# Patient Record
Sex: Male | Born: 1995 | Race: White | Hispanic: No | Marital: Single | Smoking: Never smoker
Health system: Southern US, Community
[De-identification: ages and names within clinical notes are randomized; demographics above are authoritative.]

## PROBLEM LIST (undated history)

## (undated) DIAGNOSIS — J45909 Unspecified asthma, uncomplicated: Secondary | ICD-10-CM

## (undated) DIAGNOSIS — Z789 Other specified health status: Secondary | ICD-10-CM

## (undated) HISTORY — PX: EYE SURGERY: SHX253

## (undated) HISTORY — PX: EXTERNAL EAR SURGERY: SHX627

## (undated) HISTORY — PX: INNER EAR SURGERY: SHX679

---
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
## 2017-01-29 DIAGNOSIS — L700 Acne vulgaris: Secondary | ICD-10-CM | POA: Diagnosis not present

## 2017-03-07 ENCOUNTER — Emergency Department (HOSPITAL_COMMUNITY)
Admission: EM | Admit: 2017-03-07 | Discharge: 2017-03-07 | Disposition: A | Discharge status: Home / Self Care | Payer: BLUE CROSS/BLUE SHIELD | Attending: Emergency Medicine | Admitting: Emergency Medicine

## 2017-03-07 ENCOUNTER — Encounter (HOSPITAL_COMMUNITY): Payer: Self-pay

## 2017-03-07 DIAGNOSIS — Z79899 Other long term (current) drug therapy: Secondary | ICD-10-CM | POA: Insufficient documentation

## 2017-03-07 DIAGNOSIS — J111 Influenza due to unidentified influenza virus with other respiratory manifestations: Secondary | ICD-10-CM | POA: Diagnosis not present

## 2017-03-07 DIAGNOSIS — R509 Fever, unspecified: Secondary | ICD-10-CM | POA: Diagnosis not present

## 2017-03-07 DIAGNOSIS — J45909 Unspecified asthma, uncomplicated: Secondary | ICD-10-CM | POA: Diagnosis not present

## 2017-03-07 DIAGNOSIS — R69 Illness, unspecified: Secondary | ICD-10-CM

## 2017-03-07 LAB — RAPID STREP SCREEN (MED CTR MEBANE ONLY): STREPTOCOCCUS, GROUP A SCREEN (DIRECT): NEGATIVE

## 2017-03-07 LAB — MONONUCLEOSIS SCREEN: Mono Screen: NEGATIVE

## 2017-03-07 LAB — CBG MONITORING, ED: GLUCOSE-CAPILLARY: 100 mg/dL — AB (ref 65–99)

## 2017-03-07 MED ORDER — ACETAMINOPHEN 160 MG/5ML PO SOLN
650.0000 mg | Freq: Once | ORAL | Status: AC
  Administered 2017-03-07: 650 mg via ORAL
  Filled 2017-03-07: qty 20.3

## 2017-03-07 MED ORDER — ONDANSETRON HCL 4 MG PO TABS: 4.0000 mg | ORAL_TABLET | Freq: Three times a day (TID) | ORAL | 0 refills | Status: AC | PRN

## 2017-03-07 MED ORDER — IBUPROFEN 100 MG/5ML PO SUSP
600.0000 mg | Freq: Once | ORAL | Status: AC
  Administered 2017-03-07: 600 mg via ORAL
  Filled 2017-03-07: qty 30

## 2017-03-07 NOTE — ED Triage Notes (Signed)
Fever, sore throat, body aches since Monday, taking dayquil at home.

## 2017-03-07 NOTE — Discharge Instructions (Signed)
Drink plenty of fluids. Take ibuprofen 600 mg + acetaminophen 1000 mg every 6 hrs for fever and body aches. You can take the liquid because of your sore throat. Stay home until your fever is gone. Use the zofran for nausea.  Return to the emergency room if you have uncontrolled vomiting , you feel like you getting dehydrated, or if you get short of breath.

## 2017-03-07 NOTE — ED Provider Notes (Signed)
[facility] EMERGENCY DEPARTMENT Provider Note   CSN: [id number] Arrival date & time: 03/07/17  0401  Time seen 04:20 AM   History   Chief Complaint Chief Complaint  Patient presents with  . Fever    HPI Corey Steele is a 21 y.o. male.  HPI patient reports he has had a sore throat since Monday the 22nd.  He states he has a frontal and facial headache with yellow rhinorrhea and lots of sneezing but he states he has been taking DayQuil.  He states this morning he woke up and felt like he was having chills and then he got hot and he was having diffuse myalgias and body aches which prompted him to have his mother bring him to the ED.  She states his temperature at home was 100.3.  He has taken no medications for his fever.  He has had some nausea for the past 3 days without vomiting or diarrhea.  He is unaware of anybody else with a sore throat. Mother has had a URI the past 1 1/2 weeks.   Patient also states he has had episodes where he feels weak and shaky off and on for the past couple weeks.  He states that happens about once a week.  The first time it happened after eating at a seafood restaurant.  PCP Benita Stabile, MD   Past Medical History:  Diagnosis Date  . Asthma   . Medical history non-contributory     Patient Active Problem List   Diagnosis Date Noted  . Norovirus 05/13/2014  . Abdominal pain 05/10/2014  . Gastroenteritis 05/10/2014    Past Surgical History:  Procedure Laterality Date  . EXTERNAL EAR SURGERY Right   . EYE SURGERY    . INNER EAR SURGERY         Home Medications    Prior to Admission medications   Medication Sig Start Date End Date Taking? Authorizing Provider  albuterol (PROVENTIL HFA;VENTOLIN HFA) 108 (90 Base) MCG/ACT inhaler Inhale 2 puffs into the lungs every 4 (four) hours as needed for wheezing or shortness of breath. 12/26/15   Ward, Layla Maw, DO  guaiFENesin-dextromethorphan (ROBITUSSIN DM) 100-10 MG/5ML syrup Take 5 mLs by mouth  every 4 (four) hours as needed for cough. 12/26/15   Ward, Layla Maw, DO  ibuprofen (ADVIL,MOTRIN) 800 MG tablet Take 1 tablet (800 mg total) by mouth every 8 (eight) hours as needed for mild pain. 12/26/15   Ward, Layla Maw, DO  ISOtretinoin (ACCUTANE PO) Take by mouth.    [provider]  methylphenidate (METADATE CD) 20 MG CR capsule Take 20 mg by mouth every morning.    [provider]  ondansetron (ZOFRAN ODT) 4 MG disintegrating tablet Take 1 tablet (4 mg total) by mouth every 8 (eight) hours as needed for nausea or vomiting. 12/26/15   Ward, Layla Maw, DO  ondansetron (ZOFRAN) 4 MG tablet Take 1 tablet (4 mg total) by mouth every 8 (eight) hours as needed for nausea or vomiting. 03/07/17   Devoria Albe, MD    Family History Family History  Problem Relation Age of Onset  . Asthma Mother   . COPD Mother   . Hyperlipidemia Father     Social History Social History  Substance Use Topics  . Smoking status: Never Smoker  . Smokeless tobacco: Never Used  . 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  Review of Systems Review of Systems  All other systems reviewed and are negative.    Physical Exam Updated Vital Signs BP 128/89 (BP Location: Right Arm)   Pulse 96   Temp 100 F (37.8 C) (Oral)   Resp 16   Ht 5\' 5"  (1.651 m)   Wt 56.7 kg (125 lb)   SpO2 100%   BMI 20.80 kg/m   Vital signs normal except low grade temp   Physical Exam  Constitutional: He is oriented to person, place, and time. He appears well-developed and well-nourished.  Non-toxic appearance. He does not appear ill. No distress.  HENT:  Head: Normocephalic and atraumatic.  Right Ear: External ear normal.  Left Ear: External ear normal.  Nose: Nose normal. No mucosal edema or rhinorrhea.  Mouth/Throat: Mucous membranes are normal. No dental abscesses or uvula swelling. Posterior oropharyngeal erythema present.  Mildly  enlarged tonsils  Eyes: Pupils are equal, round, and reactive to light. Conjunctivae and EOM are normal.  Neck: Normal range of motion and full passive range of motion without pain. Neck supple.  He has a few shoddy lymph nodes but no prominent lymph nodes in size or number  Cardiovascular: Normal rate, regular rhythm and normal heart sounds.  Exam reveals no gallop and no friction rub.   No murmur heard. Pulmonary/Chest: Effort normal and breath sounds normal. No respiratory distress. He has no wheezes. He has no rhonchi. He has no rales. He exhibits no tenderness and no crepitus.  Abdominal: Soft. Normal appearance and bowel sounds are normal. He exhibits no distension. There is no tenderness. There is no rebound and no guarding.  Musculoskeletal: Normal range of motion. He exhibits no edema or tenderness.  Moves all extremities well.   Lymphadenopathy:       Right: No supraclavicular and no epitrochlear adenopathy present.       Left: No supraclavicular and no epitrochlear adenopathy present.  Neurological: He is alert and oriented to person, place, and time. He has normal strength. No cranial nerve deficit.  Skin: Skin is warm, dry and intact. No rash noted. No erythema. No pallor.  Psychiatric: He has a normal mood and affect. His speech is normal and behavior is normal. His mood appears not anxious.  Nursing note and vitals reviewed.    ED Treatments / Results  Labs (all labs ordered are listed, but only abnormal results are displayed) Results for orders placed or performed during the hospital encounter of 03/07/17  Rapid strep screen  Result Value Ref Range   Streptococcus, Group A Screen (Direct) NEGATIVE NEGATIVE  Mononucleosis screen  Result Value Ref Range   Mono Screen NEGATIVE NEGATIVE  CBG monitoring, ED  Result Value Ref Range   Glucose-Capillary 100 (H) 65 - 99 mg/dL   Laboratory interpretation all normal    EKG  EKG Interpretation None       Radiology No  results found.  Procedures Procedures (including critical care time)  Medications Ordered in ED Medications  ibuprofen (ADVIL,MOTRIN) 100 MG/5ML suspension 600 mg (600 mg Oral Given 03/07/17 0433)  acetaminophen (TYLENOL) solution 650 mg (650 mg Oral Given 03/07/17 0432)     Initial Impression / Assessment and Plan / ED Course  I have reviewed the triage vital signs and the nursing notes.  Pertinent labs & imaging results that were available during my care of the patient were reviewed by me and considered in my medical decision making (see chart for details).     The patient was given  Motrin and Tylenol for his low-grade fever and myalgias.  Strep screen was done which turned out to be negative.  Monospot was done however index of suspicion was low since he did not have prominent lymphadenopathy.  A CBG was done and was normal.  We discussed having it checked the next time he felt bad.  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  Otherwise I think patient probably has a viral illness, which is been going on for 3 days.  This may be the flu, I will offer him Tamiflu if he wants.  We discussed it and I feel like he has already been sick for 3 days and he already has nausea so I really did not feel like it would be helpful.  He states he did not take the flu shot this year.  Final Clinical Impressions(s) / ED Diagnoses   Final diagnoses:  Influenza-like illness    New Prescriptions New Prescriptions   ONDANSETRON (ZOFRAN) 4 MG TABLET    Take 1 tablet (4 mg total) by mouth every 8 (eight) hours as needed for nausea or vomiting.  OTC ibuprofen and acetaminophen  Plan discharge  Devoria AlbeIva Koby Hartfield, MD, Concha PyoFACEP    Maclovia Uher, MD 03/07/17 [phone]

## 2017-03-09 LAB — CULTURE, GROUP A STREP (THRC)

## 2017-03-10 ENCOUNTER — Emergency Department (HOSPITAL_COMMUNITY)
Admission: EM | Admit: 2017-03-10 | Discharge: 2017-03-11 | Disposition: A | Discharge status: Home / Self Care | Payer: BLUE CROSS/BLUE SHIELD | Attending: Emergency Medicine | Admitting: Emergency Medicine

## 2017-03-10 ENCOUNTER — Encounter (HOSPITAL_COMMUNITY): Payer: Self-pay | Admitting: Emergency Medicine

## 2017-03-10 DIAGNOSIS — J45909 Unspecified asthma, uncomplicated: Secondary | ICD-10-CM | POA: Diagnosis not present

## 2017-03-10 DIAGNOSIS — R509 Fever, unspecified: Secondary | ICD-10-CM | POA: Insufficient documentation

## 2017-03-10 DIAGNOSIS — J029 Acute pharyngitis, unspecified: Secondary | ICD-10-CM | POA: Insufficient documentation

## 2017-03-10 DIAGNOSIS — R6889 Other general symptoms and signs: Secondary | ICD-10-CM | POA: Insufficient documentation

## 2017-03-10 MED ORDER — CLINDAMYCIN HCL 150 MG PO CAPS: 300.0000 mg | ORAL_CAPSULE | Freq: Four times a day (QID) | ORAL | 0 refills | Status: DC

## 2017-03-10 MED ORDER — IBUPROFEN 400 MG PO TABS
400.0000 mg | ORAL_TABLET | Freq: Once | ORAL | Status: AC
  Administered 2017-03-10: 400 mg via ORAL
  Filled 2017-03-10: qty 1

## 2017-03-10 NOTE — ED Triage Notes (Signed)
Returns for fever, Neg of mono and strep on Thursday.  Temp 103 at home. Gave Motrin at home, pt complaining of headache, body aches.

## 2017-03-10 NOTE — ED Provider Notes (Signed)
[facility] EMERGENCY DEPARTMENT Provider Note   CSN: [id number] Arrival date & time: 03/10/17  2044     History   Chief Complaint Chief Complaint  Patient presents with  . Fever    HPI [name] is a 21 y.o. male.  HPI  This is an 21 year old male who presents with fever, sore throat, body aches.  Patient reports onset of symptoms last week.  He was seen and evaluated on Thursday.  At that time his strep test and mono screening were negative.  This was thought to be a viral illness.  He reports taking Tylenol and Motrin at home with minimal relief of his myalgias.  He does state that his fever got somewhat better on Saturday; however, he had a fever to 103 prior to arrival today.  He reports persistent sore throat.  He states that it is not any worse but it does not seem to be getting any better.  Denies any difficulty swallowing.  Denies any cough, nausea, diarrhea.  No known sick contacts.  Past Medical History:  Diagnosis Date  . Asthma   . Medical history non-contributory     Patient Active Problem List   Diagnosis Date Noted  . Norovirus 05/13/2014  . Abdominal pain 05/10/2014  . Gastroenteritis 05/10/2014    Past Surgical History:  Procedure Laterality Date  . EXTERNAL EAR SURGERY Right   . EYE SURGERY    . INNER EAR SURGERY         Home Medications    Prior to Admission medications   Medication Sig Start Date End Date Taking? Authorizing Provider  ondansetron (ZOFRAN) 4 MG tablet Take 1 tablet (4 mg total) by mouth every 8 (eight) hours as needed for nausea or vomiting. 03/07/17  Yes Devoria AlbeKnapp, Iva, MD  clindamycin (CLEOCIN) 150 MG capsule Take 2 capsules (300 mg total) by mouth every 6 (six) hours. 03/10/17   Horton, Mayer Maskerourtney F, MD    Family History Family History  Problem Relation Age of Onset  . Asthma Mother   . COPD Mother   . Hyperlipidemia Father     Social History Social History  Substance Use Topics  . Smoking status: Never Smoker  .  Smokeless tobacco: Never Used  . Alcohol use No     Allergies   Patient has no known allergies.   Review of Systems Review of Systems  Constitutional: Positive for fever.  HENT: Positive for sore throat. Negative for trouble swallowing.   Respiratory: Negative for cough and shortness of breath.   Cardiovascular: Negative for chest pain.  Gastrointestinal: Negative for abdominal pain, nausea and vomiting.  All other systems reviewed and are negative.    Physical Exam Updated Vital Signs BP 127/77 (BP Location: Right Arm)   Pulse (!) 108   Temp (!) 100.6 F (38.1 C)   Resp 20   Ht 5\' 5"  (1.651 m)   Wt 56.7 kg (125 lb)   SpO2 100%   BMI 20.80 kg/m   Physical Exam  Constitutional: He is oriented to person, place, and time. He appears well-developed and well-nourished. No distress.  HENT:  Head: Normocephalic and atraumatic.  No tonsillar exudate noted, right posterior oropharynx without significant erythema, mild right-sided tonsillar swelling with deviation of the uvula to the right, no obvious tonsillar abscess  Eyes: Pupils are equal, round, and reactive to light.  Neck: Neck supple.  Cardiovascular: Normal rate, regular rhythm and normal heart sounds.   No murmur heard. Pulmonary/Chest: Effort normal and  breath sounds normal. No respiratory distress. He has no wheezes.  Abdominal: Soft. Bowel sounds are normal. There is tenderness. There is no rebound.  Mild diffuse tenderness palpation, no signs of peritonitis  Musculoskeletal: He exhibits no edema.  Lymphadenopathy:    He has no cervical adenopathy.  Neurological: He is alert and oriented to person, place, and time.  Skin: Skin is warm and dry. No rash noted.  Warm to touch  Psychiatric: He has a normal mood and affect.  Nursing note and vitals reviewed.    ED Treatments / Results  Labs (all labs ordered are listed, but only abnormal results are displayed) Labs Reviewed - No data to display  EKG  EKG  Interpretation None       Radiology No results found.  Procedures Procedures (including critical care time)  Medications Ordered in ED Medications  ibuprofen (ADVIL,MOTRIN) tablet 400 mg (400 mg Oral Given 03/10/17 2248)     Initial Impression / Assessment and Plan / ED Course  I have reviewed the triage vital signs and the nursing notes.  Pertinent labs & imaging results that were available during my care of the patient were reviewed by me and considered in my medical decision making (see chart for details).     Patient presents with persistent fever and flulike symptoms.  He is overall nontoxic appearing.  Physical exam is largely bedbound with the exception of mild asymmetric tonsillar enlargement.  This could represent developing tonsillar abscess; however, on exam there is no obvious tonsillar abscess that is drainable.  He has no significant lymphadenopathy.  Patient was given fluids.  Suspect that this is a viral illness and could be the flu.  Discussed with the patient and his family that supportive measures are still recommended.  If he continues to have right-sided sore throat symptoms or worsening of his right-sided symptoms, he will need reevaluation by his primary physician or ENT in the next 24-48 hours.  I will provide him with a prescription for clindamycin to cover for peritonsillar abscess if he is not improving in the next 24-48 hours; however, I have requested they hold this prescription until repeat examination.  Patient and his family state understanding.  After history, exam, and medical workup I feel the patient has been appropriately medically screened and is safe for discharge home. Pertinent diagnoses were discussed with the patient. Patient was given return precautions.   Final Clinical Impressions(s) / ED Diagnoses   Final diagnoses:  Flu-like symptoms  Sore throat    New Prescriptions New Prescriptions   CLINDAMYCIN (CLEOCIN) 150 MG CAPSULE    Take  2 capsules (300 mg total) by mouth every 6 (six) hours.     Shon Baton, MD 03/10/17 [phone]

## 2017-03-10 NOTE — Discharge Instructions (Signed)
You was seen today for continued fever and flulike symptoms.  This is likely a virus.  You did have slight enlargement of her right tonsil.  If this continues to enlarge or he continued to have worsening right-sided symptoms, this could become a peritonsillar abscess.  You need to follow-up with your primary physician or ENT in 24-48 hours if you do have persistent right-sided symptoms.  You will be given a prescription for an antibiotic if this turns into an abscess.  However, did not get prescription filled until reassessment by another physician if symptoms are worsening.  Antibiotics do not treat viruses.  Hydration is very important.  Continue Tylenol and Motrin at home for body aches and fever.

## 2017-03-11 NOTE — ED Notes (Signed)
Pt alert & oriented x4, stable gait. Patient given discharge instructions, paperwork & prescription(s). Patient  instructed to stop at the registration desk to finish any additional paperwork. Patient verbalized understanding. Pt left department w/ no further questions. 

## 2017-03-14 ENCOUNTER — Ambulatory Visit (INDEPENDENT_AMBULATORY_CARE_PROVIDER_SITE_OTHER): Payer: BLUE CROSS/BLUE SHIELD | Admitting: Otolaryngology

## 2017-03-14 DIAGNOSIS — J039 Acute tonsillitis, unspecified: Secondary | ICD-10-CM

## 2017-03-14 DIAGNOSIS — J351 Hypertrophy of tonsils: Secondary | ICD-10-CM | POA: Diagnosis not present

## 2017-04-24 DIAGNOSIS — J069 Acute upper respiratory infection, unspecified: Secondary | ICD-10-CM | POA: Diagnosis not present

## 2017-07-15 DIAGNOSIS — J069 Acute upper respiratory infection, unspecified: Secondary | ICD-10-CM | POA: Diagnosis not present

## 2018-05-29 DIAGNOSIS — F909 Attention-deficit hyperactivity disorder, unspecified type: Secondary | ICD-10-CM | POA: Diagnosis not present

## 2018-07-03 DIAGNOSIS — F909 Attention-deficit hyperactivity disorder, unspecified type: Secondary | ICD-10-CM | POA: Diagnosis not present

## 2018-10-02 DIAGNOSIS — F909 Attention-deficit hyperactivity disorder, unspecified type: Secondary | ICD-10-CM | POA: Diagnosis not present

## 2018-11-10 DIAGNOSIS — N509 Disorder of male genital organs, unspecified: Secondary | ICD-10-CM | POA: Diagnosis not present

## 2019-04-16 ENCOUNTER — Other Ambulatory Visit: Payer: Self-pay

## 2019-04-16 DIAGNOSIS — Z20822 Contact with and (suspected) exposure to covid-19: Secondary | ICD-10-CM

## 2019-04-20 DIAGNOSIS — U071 COVID-19: Secondary | ICD-10-CM | POA: Diagnosis not present

## 2019-04-20 LAB — NOVEL CORONAVIRUS, NAA: SARS-CoV-2, NAA: DETECTED — AB

## 2019-08-20 DIAGNOSIS — J069 Acute upper respiratory infection, unspecified: Secondary | ICD-10-CM | POA: Diagnosis not present

## 2019-08-20 DIAGNOSIS — J302 Other seasonal allergic rhinitis: Secondary | ICD-10-CM | POA: Diagnosis not present

## 2019-08-20 DIAGNOSIS — B308 Other viral conjunctivitis: Secondary | ICD-10-CM | POA: Diagnosis not present

## 2020-10-02 ENCOUNTER — Ambulatory Visit
Admission: EM | Admit: 2020-10-02 | Discharge: 2020-10-02 | Disposition: A | Discharge status: Home / Self Care | Payer: BC Managed Care – PPO | Attending: Family Medicine | Admitting: Family Medicine

## 2020-10-02 ENCOUNTER — Other Ambulatory Visit: Payer: Self-pay

## 2020-10-02 DIAGNOSIS — H6591 Unspecified nonsuppurative otitis media, right ear: Secondary | ICD-10-CM

## 2020-10-02 DIAGNOSIS — J069 Acute upper respiratory infection, unspecified: Secondary | ICD-10-CM

## 2020-10-02 MED ORDER — BENZONATATE 100 MG PO CAPS: 200.0000 mg | ORAL_CAPSULE | Freq: Three times a day (TID) | ORAL | 0 refills | Status: DC | PRN

## 2020-10-02 MED ORDER — AMOXICILLIN-POT CLAVULANATE 875-125 MG PO TABS: 1.0000 | ORAL_TABLET | Freq: Two times a day (BID) | ORAL | 0 refills | Status: DC

## 2020-10-02 NOTE — ED Provider Notes (Signed)
[facility]    CSN: [id number] Arrival date & time: 10/02/20  0804      History   Chief Complaint Chief Complaint  Patient presents with  . Nasal Congestion  . Otalgia    HPI Corey Steele is a 25 y.o. male.   HPI  Patient presents today for evaluation of nasal congestion and bilateral ear pain with the right ear being the worst.  Symptoms started over 5 days ago.  He is also had some bilateral eye drainage during the course of this current illness.  His most worrisome concern is the pain in his ear and he has noticed some diminished hearing related to the ear pain.  He now also has a mild cough which is nonproductive.  No history of asthma.  He is a non-smoker.  Patient has had COVID previously and is fully vaccinated.  He does work for [employer].  Past Medical History:  Diagnosis Date  . Asthma   . Medical history non-contributory     Patient Active Problem List   Diagnosis Date Noted  . Norovirus 05/13/2014  . Abdominal pain 05/10/2014  . Gastroenteritis 05/10/2014    Past Surgical History:  Procedure Laterality Date  . EXTERNAL EAR SURGERY Right   . EYE SURGERY    . INNER EAR SURGERY         Home Medications    Prior to Admission medications   Medication Sig Start Date End Date Taking? Authorizing Provider  clindamycin (CLEOCIN) 150 MG capsule Take 2 capsules (300 mg total) by mouth every 6 (six) hours. 03/10/17   Horton, Mayer Masker, MD  ondansetron (ZOFRAN) 4 MG tablet Take 1 tablet (4 mg total) by mouth every 8 (eight) hours as needed for nausea or vomiting. 03/07/17   Devoria Albe, MD    Family History Family History  Problem Relation Age of Onset  . Asthma Mother   . COPD Mother   . Hyperlipidemia Father     Social History Social History   Tobacco Use  . Smoking status: Never Smoker  . Smokeless tobacco: Never Used  Substance Use Topics  . Alcohol use: No  . Drug use: No     Allergies   Patient has no known allergies.   Review  of Systems Review of Systems Pertinent negatives listed in HPI  Physical Exam Triage Vital Signs ED Triage Vitals  Enc Vitals Group     BP 10/02/20 0822 123/81     Pulse Rate 10/02/20 0822 68     Resp 10/02/20 0822 18     Temp 10/02/20 0822 97.8 F (36.6 C)     Temp src --      SpO2 10/02/20 0822 98 %     Weight --      Height --      Head Circumference --      Peak Flow --      Pain Score 10/02/20 0820 8     Pain Loc --      Pain Edu? --      Excl. in GC? --    No data found.  Updated Vital Signs BP 123/81   Pulse 68   Temp 97.8 F (36.6 C)   Resp 18   SpO2 98%   Visual Acuity Right Eye Distance:   Left Eye Distance:   Bilateral Distance:    Right Eye Near:   Left Eye Near:    Bilateral Near:     Physical Exam  General  Appearance:    Alert, cooperative, no distress  HENT:   Normocephalic, Right ear bulging erythematous with suppurative changes , ears normal, nares mucosal edema with congestion, rhinorrhea, oropharynx patient  Eyes:    PERRL, conjunctiva/corneas clear, EOM's intact       Lungs:     Clear to auscultation bilaterally, respirations unlabored  Heart:    Regular rate and rhythm  Neurologic:   Awake, alert, oriented x 3. No apparent focal neurological           defect.      UC Treatments / Results  Labs (all labs ordered are listed, but only abnormal results are displayed) Labs Reviewed - No data to display  EKG   Radiology No results found.  Procedures Procedures (including critical care time)  Medications Ordered in UC Medications - No data to display  Initial Impression / Assessment and Plan / UC Course  I have reviewed the triage vital signs and the nursing notes.  Pertinent labs & imaging results that were available during my care of the patient were reviewed by me and considered in my medical decision making (see chart for details).    Right otitis media with a viral upper respiratory illness with cough.  Patient currently  is outside of the window for COVID isolation however we discussed my increased concern that he likely has been infected with COVID virus and at present given the onset of his symptoms he would be out of the quarantine period.  He is not febrile however he has developed a secondary ear infection.  Treating with Augmentin.  Benzonatate Perles prescribed for cough.  Patient given a note to remain out of work today. Final Clinical Impressions(s) / UC Diagnoses   Final diagnoses:  Right otitis media with effusion  Viral URI with cough   Discharge Instructions   None    ED Prescriptions    Medication Sig Dispense Auth. Provider   amoxicillin-clavulanate (AUGMENTIN) 875-125 MG tablet  (Status: Discontinued) Take 1 tablet by mouth 2 (two) times daily. 20 tablet Bing Neighbors, FNP   benzonatate (TESSALON) 100 MG capsule  (Status: Discontinued) Take 2 capsules (200 mg total) by mouth 3 (three) times daily as needed for cough. 40 capsule Bing Neighbors, FNP   amoxicillin-clavulanate (AUGMENTIN) 875-125 MG tablet Take 1 tablet by mouth 2 (two) times daily. 20 tablet Bing Neighbors, FNP   benzonatate (TESSALON) 100 MG capsule Take 2 capsules (200 mg total) by mouth 3 (three) times daily as needed for cough. 40 capsule Bing Neighbors, FNP     PDMP not reviewed this encounter.   Bing Neighbors, [place] 10/02/20 [phone]

## 2020-10-02 NOTE — ED Triage Notes (Signed)
Pt presents with c/o nasal congestion and ear pain since Tuesday

## 2020-10-13 DIAGNOSIS — F909 Attention-deficit hyperactivity disorder, unspecified type: Secondary | ICD-10-CM | POA: Diagnosis not present

## 2020-10-13 DIAGNOSIS — H6693 Otitis media, unspecified, bilateral: Secondary | ICD-10-CM | POA: Diagnosis not present

## 2020-11-11 DIAGNOSIS — F909 Attention-deficit hyperactivity disorder, unspecified type: Secondary | ICD-10-CM | POA: Diagnosis not present

## 2020-11-30 ENCOUNTER — Encounter: Payer: Self-pay | Admitting: Emergency Medicine

## 2020-11-30 ENCOUNTER — Ambulatory Visit
Admission: EM | Admit: 2020-11-30 | Discharge: 2020-11-30 | Disposition: A | Discharge status: Home / Self Care | Payer: BC Managed Care – PPO | Attending: Family Medicine | Admitting: Family Medicine

## 2020-11-30 ENCOUNTER — Other Ambulatory Visit: Payer: Self-pay

## 2020-11-30 ENCOUNTER — Encounter (HOSPITAL_BASED_OUTPATIENT_CLINIC_OR_DEPARTMENT_OTHER): Payer: Self-pay

## 2020-11-30 DIAGNOSIS — J45909 Unspecified asthma, uncomplicated: Secondary | ICD-10-CM | POA: Insufficient documentation

## 2020-11-30 DIAGNOSIS — X500XXA Overexertion from strenuous movement or load, initial encounter: Secondary | ICD-10-CM | POA: Diagnosis not present

## 2020-11-30 DIAGNOSIS — S39011A Strain of muscle, fascia and tendon of abdomen, initial encounter: Secondary | ICD-10-CM

## 2020-11-30 DIAGNOSIS — S3991XA Unspecified injury of abdomen, initial encounter: Secondary | ICD-10-CM | POA: Diagnosis not present

## 2020-11-30 NOTE — ED Provider Notes (Signed)
[facility] CENTER   [id number] 11/30/20 Arrival Time: 1728  ASSESSMENT & PLAN:  1. Abdominal wall strain, initial encounter    Likely strain of abdominal muscle. No hernia appreciated.  Benign abdominal exam. No indications for urgent abdominal/pelvic imaging at this time. Activities as tolerated.   Follow-up Information     Schedule an appointment as soon as possible for a visit  with Myra Rude, MD.   Specialties: Family Medicine, Emergency Medicine, Sports Medicine Contact information: [address] [place] [address] [phone]                Reviewed expectations re: course of current medical issues. Questions answered. Outlined signs and symptoms indicating need for more acute intervention. Patient verbalized understanding. After Visit Summary given.   SUBJECTIVE: History from: patient. Corey Steele is a 25 y.o. 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 "Felt like a pop but didn't think anything of it". Soreness shortly after; stable. Fever: absent. Aggravating factors: include certain movements; lifting. Alleviating factors: have not been identified. Associated symptoms: none. He denies constipation, diarrhea, dysuria, fever, headache, nausea, and vomiting. Appetite: normal. PO intake: normal. Ambulatory without assistance. Urinary symptoms: none. Bowel movements: have not significantly changed. History of similar: no. OTC treatment: none.  Past Surgical History:  Procedure Laterality Date   EXTERNAL EAR SURGERY Right    EYE SURGERY     INNER EAR SURGERY     OBJECTIVE:  Vitals:   11/30/20 1736  BP: 128/80  Pulse: (!) 101  Resp: 16  Temp: 98.4 F (36.9 C)  TempSrc: Oral  SpO2: 98%    General appearance: alert, oriented, no acute distress HEENT: [place]; AT; oropharynx moist Lungs: unlabored respirations Abdomen: soft; without distention; no  specific tenderness to palpation; without masses or organomegaly; or hernia without guarding or rebound tenderness Back: without reported CVA tenderness; FROM at waist Extremities: without LE edema; symmetrical; without gross deformities Skin: warm and dry Neurologic: normal gait Psychological: alert and cooperative; normal mood and affect  No Known Allergies                                             Past Medical History:  Diagnosis Date   Asthma    Medical history non-contributory     Social History   Socioeconomic History   Marital status: Single    Spouse name: Not on file   Number of children: Not on file   Years of education: Not on file   Highest education level: Not on file  Occupational History   Not on file  Tobacco Use   Smoking status: Never   Smokeless tobacco: Never  Substance and Sexual Activity   Alcohol use: No   Drug use: No   Sexual activity: Not on file  Other Topics Concern   Not on file  Social History Narrative   Not on file   Social Determinants of Health   Financial Resource Strain: Not on file  Food Insecurity: Not on file  Transportation Needs: Not on file  Physical Activity: Not on file  Stress: Not on file  Social Connections: Not on file  Intimate Partner Violence: Not on file    Family History  Problem Relation Age of Onset   Asthma Mother  COPD Mother    Hyperlipidemia Father      Mardella Layman, MD 11/30/20 [phone]

## 2020-11-30 NOTE — ED Triage Notes (Signed)
Pt reports lump to his belly. Concerned for hernia   Denies fever / N/ V

## 2020-11-30 NOTE — ED Triage Notes (Signed)
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  Area hurts when breathing.  States he has a small knot in that area.

## 2020-12-01 ENCOUNTER — Emergency Department (HOSPITAL_BASED_OUTPATIENT_CLINIC_OR_DEPARTMENT_OTHER)
Admission: EM | Admit: 2020-12-01 | Discharge: 2020-12-01 | Disposition: A | Discharge status: Home / Self Care | Payer: BC Managed Care – PPO | Attending: Emergency Medicine | Admitting: Emergency Medicine

## 2020-12-01 DIAGNOSIS — S39011A Strain of muscle, fascia and tendon of abdomen, initial encounter: Secondary | ICD-10-CM | POA: Diagnosis not present

## 2020-12-01 NOTE — ED Provider Notes (Signed)
[facility] Provider Note: Corey Dell, MD, FACEP  CSN: [id number] MRN: [id number] ARRIVAL: 11/30/20 at 2203 ROOM: DB010/DB010   CHIEF COMPLAINT  Abdominal Pain   HISTORY OF PRESENT ILLNESS  12/01/20 3:49 AM Corey Steele is a 25 y.o. 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  He had an immediate burning pain but now he just has some mild soreness.  He still feels what he believes is a mass to the right of his umbilicus.  Because this was a workman's compensation case he was advised to have this evaluated.  He has had no nausea, vomiting, abdominal bloating or difficulty defecating.  The mass is more palpable when he stands.   Past Medical History:  Diagnosis Date   Asthma    Medical history non-contributory     Past Surgical History:  Procedure Laterality Date   EXTERNAL EAR SURGERY Right    EYE SURGERY     INNER EAR SURGERY      Family History  Problem Relation Age of Onset   Asthma Mother    COPD Mother    Hyperlipidemia Father     Social History   Tobacco Use   Smoking status: Never   Smokeless tobacco: Never  Substance Use Topics   Alcohol use: No   Drug use: No    Prior to Admission medications   Medication Sig Start Date End Date Taking? Authorizing Provider  ondansetron (ZOFRAN) 4 MG tablet Take 1 tablet (4 mg total) by mouth every 8 (eight) hours as needed for nausea or vomiting. 03/07/17   Devoria Albe, MD    Allergies Patient has no known allergies.   REVIEW OF SYSTEMS  Negative except as noted here or in the History of Present Illness.   PHYSICAL EXAMINATION  Initial Vital Signs Blood pressure 111/82, pulse 64, temperature 98.3 F (36.8 C), temperature source Oral, resp. rate 18, height 5\' 6"  (1.676 m), weight 59 kg, SpO2 100 %.  Examination General: Well-developed, well-nourished male in no acute distress; appearance consistent with  age of record HENT: normocephalic; atraumatic Eyes: Normal appearance Neck: supple Heart: regular rate and rhythm Lungs: clear to auscultation bilaterally Abdomen: soft; nondistended; prominence and mild tenderness of the right rectus abdominis muscle; bowel sounds present Extremities: No deformity; full range of motion Neurologic: Awake, alert and oriented; motor function intact in all extremities and symmetric; no facial droop Skin: Warm and dry Psychiatric: Normal mood and affect   RESULTS  Summary of this visit's results, reviewed and interpreted by myself:   EKG Interpretation  Date/Time:    Ventricular Rate:    PR Interval:    QRS Duration:   QT Interval:    QTC Calculation:   R Axis:     Text Interpretation:         Laboratory Studies: No results found for this or any previous visit (from the past 24 hour(s)). Imaging Studies: No results found.  ED COURSE and MDM  Nursing notes, initial and subsequent vitals signs, including pulse oximetry, reviewed and interpreted by myself.  Vitals:   11/30/20 2228 11/30/20 2230 12/01/20 0200  BP: 117/82  111/82  Pulse: 70  64  Resp: 18  18  Temp: 98.3 F (36.8 C)    TempSrc: Oral    SpO2: 100%  100%  Weight:  59 kg   Height:  5\' 6"  (  1.676 m)    Medications - No data to display  Examination is more consistent with spasm or strain of the right rectus abdominis muscle.  I feel no mass consistent with a hernia and he has had no other GI symptoms.  I do not believe a CT scan would be beneficial at this point, especially at his age.  He was advised that if symptoms worsen or change to have it reevaluated.  PROCEDURES  Procedures   ED DIAGNOSES     ICD-10-CM   1. Strain of rectus abdominis muscle, initial encounter  S39.011A          Mohab Ashby, Jonny Ruiz, MD 12/01/20 [phone]

## 2021-02-16 DIAGNOSIS — F909 Attention-deficit hyperactivity disorder, unspecified type: Secondary | ICD-10-CM | POA: Diagnosis not present

## 2021-04-21 ENCOUNTER — Other Ambulatory Visit (HOSPITAL_COMMUNITY): Payer: Self-pay

## 2021-04-26 ENCOUNTER — Other Ambulatory Visit (HOSPITAL_COMMUNITY): Payer: Self-pay

## 2021-04-26 MED ORDER — METHYLPHENIDATE HCL ER (OSM) 36 MG PO TBCR
36.0000 mg | EXTENDED_RELEASE_TABLET | Freq: Every day | ORAL | 0 refills | Status: DC
  Filled 2021-04-26: qty 30, 30d supply, fill #0

## 2021-04-26 MED ORDER — METHYLPHENIDATE HCL ER (OSM) 36 MG PO TBCR: 36.0000 mg | EXTENDED_RELEASE_TABLET | Freq: Every day | ORAL | 0 refills | Status: DC

## 2021-04-26 MED ORDER — METHYLPHENIDATE HCL ER (OSM) 36 MG PO TBCR
36.0000 mg | EXTENDED_RELEASE_TABLET | Freq: Every day | ORAL | 0 refills | Status: AC
  Filled 2021-04-26: qty 30, 30d supply, fill #0

## 2022-10-02 ENCOUNTER — Other Ambulatory Visit (HOSPITAL_COMMUNITY): Payer: Self-pay

## 2022-10-02 MED ORDER — METHYLPHENIDATE HCL ER 36 MG PO TB24
36.0000 mg | ORAL_TABLET | Freq: Every day | ORAL | 0 refills | Status: AC
  Filled 2022-10-02 – 2022-12-03 (×2): qty 30, 30d supply, fill #0

## 2022-10-18 ENCOUNTER — Other Ambulatory Visit (HOSPITAL_COMMUNITY): Payer: Self-pay

## 2022-12-03 ENCOUNTER — Other Ambulatory Visit (HOSPITAL_COMMUNITY): Payer: Self-pay

## 2023-01-02 ENCOUNTER — Ambulatory Visit
Admission: EM | Admit: 2023-01-02 | Discharge: 2023-01-02 | Disposition: A | Discharge status: Home / Self Care | Payer: 59 | Attending: Family Medicine | Admitting: Family Medicine

## 2023-01-02 ENCOUNTER — Ambulatory Visit: Payer: 59

## 2023-01-02 DIAGNOSIS — M79672 Pain in left foot: Secondary | ICD-10-CM

## 2023-01-02 DIAGNOSIS — Z0389 Encounter for observation for other suspected diseases and conditions ruled out: Secondary | ICD-10-CM | POA: Diagnosis not present

## 2023-01-02 MED ORDER — DOXYCYCLINE HYCLATE 100 MG PO CAPS: 100.0000 mg | ORAL_CAPSULE | Freq: Two times a day (BID) | ORAL | 0 refills | Status: AC

## 2023-01-02 NOTE — ED Provider Notes (Signed)
  [facility]   [id number] 01/02/23 Arrival Time: 1110  ASSESSMENT & PLAN:  1. Left foot pain    I have personally viewed and independently interpreted the imaging studies ordered this visit. No appreciable radiopaque FB in L foot.  Trial of doxy.  Discharge Medication List as of 01/02/2023  1:32 PM     START taking these medications   Details  doxycycline (VIBRAMYCIN) 100 MG capsule Take 1 capsule (100 mg total) by mouth 2 (two) times daily., Starting Wed 01/02/2023, Normal       Recommend:  Follow-up Information     [facility]).   Why: If worsening or failing to improve as anticipated. Contact information: [address] [place],  [place]  [address]  [phone]                Reviewed expectations re: course of current medical issues. Questions answered. Outlined signs and symptoms indicating need for more acute intervention. Understanding verbalized. After Visit Summary given.   SUBJECTIVE: History from: Patient. Corey Steele is a 27 y.o. male. Pt c/o left foot pain, after getting in the ocean last week, he is unsure if he was stung by a jelly fish or if he stepped on something, the sore is midway the outer side of the foot. Redness and some swelling. Pt states it is painful.  Denies: fever. Normal PO intake without n/v/d.  OBJECTIVE:  Vitals:   01/02/23 1257  BP: 110/77  Pulse: (!) 58  Resp: 17  Temp: (!) 97.3 F (36.3 C)  TempSrc: Oral  SpO2: 99%    General appearance: alert; no distress Lungs: speaks full sentences without difficulty; unlabored Skin: warm and dry; mid lateral LEFT foot with approx 1 cm erythematous induration with central darkening; without drainage or bleeding; mild TTP Neurologic: normal gait Psychological: alert and cooperative; normal mood and affect   No Known Allergies  Past Medical History:  Diagnosis Date   Asthma    Medical history non-contributory    Social History    Socioeconomic History   Marital status: Single    Spouse name: Not on file   Number of children: Not on file   Years of education: Not on file   Highest education level: Not on file  Occupational History   Not on file  Tobacco Use   Smoking status: Never   Smokeless tobacco: Never  Substance and Sexual Activity   Alcohol use: No   Drug use: No   Sexual activity: Not on file  Other Topics Concern   Not on file  Social History Narrative   Not on file   Social Determinants of Health   Financial Resource Strain: Not on file  Food Insecurity: Not on file  Transportation Needs: Not on file  Physical Activity: Not on file  Stress: Not on file  Social Connections: Not on file  Intimate Partner Violence: Not on file   Family History  Problem Relation Age of Onset   Asthma Mother    COPD Mother    Hyperlipidemia Father    Past Surgical History:  Procedure Laterality Date   EXTERNAL EAR SURGERY Right    EYE SURGERY     INNER EAR SURGERY       Mardella Layman, MD 01/02/23 1345

## 2023-01-02 NOTE — ED Triage Notes (Signed)
Pt c/o left foot pain, after getting in the ocean last week, he is unsure if he was stung by a jelly fish or if he stepped on something, the sore is midway the outer side of the foot. Redness and some swelling. Pt states it is painful.

## 2023-04-10 DIAGNOSIS — Z0000 Encounter for general adult medical examination without abnormal findings: Secondary | ICD-10-CM | POA: Diagnosis not present

## 2023-05-06 ENCOUNTER — Other Ambulatory Visit (HOSPITAL_COMMUNITY): Payer: Self-pay

## 2023-05-06 MED ORDER — METHYLPHENIDATE HCL ER (OSM) 36 MG PO TBCR
36.0000 mg | EXTENDED_RELEASE_TABLET | Freq: Every day | ORAL | 0 refills | Status: DC
  Filled 2023-05-06: qty 30, 30d supply, fill #0

## 2023-07-04 ENCOUNTER — Other Ambulatory Visit (HOSPITAL_COMMUNITY): Payer: Self-pay

## 2023-07-04 MED ORDER — METHYLPHENIDATE HCL ER (OSM) 36 MG PO TBCR
36.0000 mg | EXTENDED_RELEASE_TABLET | Freq: Every day | ORAL | 0 refills | Status: DC
  Filled 2023-07-04: qty 30, 30d supply, fill #0

## 2023-07-09 ENCOUNTER — Other Ambulatory Visit (HOSPITAL_COMMUNITY): Payer: Self-pay

## 2023-08-14 ENCOUNTER — Other Ambulatory Visit (HOSPITAL_COMMUNITY): Payer: Self-pay

## 2023-08-15 ENCOUNTER — Other Ambulatory Visit (HOSPITAL_COMMUNITY): Payer: Self-pay

## 2023-08-15 MED ORDER — METHYLPHENIDATE HCL ER (OSM) 36 MG PO TBCR
36.0000 mg | EXTENDED_RELEASE_TABLET | Freq: Every day | ORAL | 0 refills | Status: DC
  Filled 2023-08-15: qty 30, 30d supply, fill #0

## 2023-09-17 ENCOUNTER — Other Ambulatory Visit (HOSPITAL_COMMUNITY): Payer: Self-pay

## 2023-09-18 ENCOUNTER — Other Ambulatory Visit (HOSPITAL_COMMUNITY): Payer: Self-pay

## 2023-09-18 ENCOUNTER — Encounter (HOSPITAL_COMMUNITY): Payer: Self-pay

## 2023-09-18 MED ORDER — METHYLPHENIDATE HCL ER (OSM) 36 MG PO TBCR
36.0000 mg | EXTENDED_RELEASE_TABLET | Freq: Every day | ORAL | 0 refills | Status: AC
  Filled 2023-09-18: qty 30, 30d supply, fill #0

## 2023-09-20 ENCOUNTER — Other Ambulatory Visit (HOSPITAL_COMMUNITY): Payer: Self-pay
